# Patient Record
Sex: Female | Born: 1945 | Race: White | Hispanic: No | Marital: Married | Smoking: Never smoker
Health system: Southern US, Community
[De-identification: ages and names within clinical notes are randomized; demographics above are authoritative.]

## PROBLEM LIST (undated history)

## (undated) DIAGNOSIS — G4730 Sleep apnea, unspecified: Secondary | ICD-10-CM

## (undated) DIAGNOSIS — J45909 Unspecified asthma, uncomplicated: Secondary | ICD-10-CM

## (undated) HISTORY — PX: BREAST BIOPSY: SHX20

## (undated) HISTORY — PX: COLONOSCOPY: SHX174

## (undated) HISTORY — PX: UPPER GI ENDOSCOPY: SHX6162

---

## 2022-02-15 ENCOUNTER — Other Ambulatory Visit: Payer: Self-pay | Admitting: Infectious Diseases

## 2022-02-15 DIAGNOSIS — Z1231 Encounter for screening mammogram for malignant neoplasm of breast: Secondary | ICD-10-CM

## 2022-02-16 ENCOUNTER — Ambulatory Visit
Admission: RE | Admit: 2022-02-16 | Discharge: 2022-02-16 | Disposition: A | Discharge status: Home / Self Care | Payer: Medicare PPO | Source: Ambulatory Visit | Attending: Infectious Diseases | Admitting: Infectious Diseases

## 2022-02-16 DIAGNOSIS — Z1231 Encounter for screening mammogram for malignant neoplasm of breast: Secondary | ICD-10-CM | POA: Diagnosis present

## 2022-02-20 ENCOUNTER — Inpatient Hospital Stay
Admission: RE | Admit: 2022-02-20 | Discharge: 2022-02-20 | Disposition: A | Discharge status: Home / Self Care | Payer: Self-pay | Source: Ambulatory Visit | Attending: *Deleted | Admitting: *Deleted

## 2022-02-20 ENCOUNTER — Other Ambulatory Visit: Payer: Self-pay | Admitting: *Deleted

## 2022-02-20 DIAGNOSIS — Z1231 Encounter for screening mammogram for malignant neoplasm of breast: Secondary | ICD-10-CM

## 2022-08-22 ENCOUNTER — Encounter: Payer: Self-pay | Admitting: *Deleted

## 2022-08-22 ENCOUNTER — Ambulatory Visit
Admission: RE | Admit: 2022-08-22 | Discharge: 2022-08-22 | Disposition: A | Discharge status: Home / Self Care | Payer: Medicare PPO | Attending: Gastroenterology | Admitting: Gastroenterology

## 2022-08-22 ENCOUNTER — Other Ambulatory Visit: Payer: Self-pay

## 2022-08-22 ENCOUNTER — Ambulatory Visit: Payer: Medicare PPO | Admitting: Anesthesiology

## 2022-08-22 ENCOUNTER — Encounter
Admission: RE | Disposition: A | Discharge status: Home / Self Care | Payer: Self-pay | Source: Home / Self Care | Attending: Gastroenterology

## 2022-08-22 DIAGNOSIS — Z1211 Encounter for screening for malignant neoplasm of colon: Secondary | ICD-10-CM | POA: Diagnosis present

## 2022-08-22 DIAGNOSIS — J45909 Unspecified asthma, uncomplicated: Secondary | ICD-10-CM | POA: Diagnosis not present

## 2022-08-22 DIAGNOSIS — G4730 Sleep apnea, unspecified: Secondary | ICD-10-CM | POA: Insufficient documentation

## 2022-08-22 DIAGNOSIS — Z86010 Personal history of colonic polyps: Secondary | ICD-10-CM | POA: Diagnosis not present

## 2022-08-22 HISTORY — DX: Unspecified asthma, uncomplicated: J45.909

## 2022-08-22 HISTORY — DX: Sleep apnea, unspecified: G47.30

## 2022-08-22 HISTORY — PX: COLONOSCOPY WITH PROPOFOL: SHX5780

## 2022-08-22 SURGERY — COLONOSCOPY WITH PROPOFOL
Anesthesia: General

## 2022-08-22 MED ORDER — SODIUM CHLORIDE 0.9 % IV SOLN: INTRAVENOUS | Status: DC

## 2022-08-22 MED ORDER — PROPOFOL 10 MG/ML IV BOLUS
INTRAVENOUS | Status: DC | PRN
  Administered 2022-08-22: 70 mg via INTRAVENOUS

## 2022-08-22 MED ORDER — PROPOFOL 500 MG/50ML IV EMUL
INTRAVENOUS | Status: DC | PRN
  Administered 2022-08-22: 150 ug/kg/min via INTRAVENOUS

## 2022-08-22 MED ORDER — LIDOCAINE HCL (CARDIAC) PF 100 MG/5ML IV SOSY
PREFILLED_SYRINGE | INTRAVENOUS | Status: DC | PRN
  Administered 2022-08-22: 50 mg via INTRAVENOUS

## 2022-08-22 NOTE — Anesthesia Procedure Notes (Signed)
Date/Time: 08/22/2022 2:18 PM  Performed by: Ginger Carne, CRNAPre-anesthesia Checklist: Patient identified, Emergency Drugs available, Suction available, Patient being monitored and Timeout performed Patient Re-evaluated:Patient Re-evaluated prior to induction Oxygen Delivery Method: Nasal cannula Preoxygenation: Pre-oxygenation with 100% oxygen Induction Type: IV induction

## 2022-08-22 NOTE — Transfer of Care (Signed)
Immediate Anesthesia Transfer of Care Note  Patient: Jodi Griffin  Procedure(s) Performed: COLONOSCOPY WITH PROPOFOL  Patient Location: PACU  Anesthesia Type:General  Level of Consciousness: awake and drowsy  Airway & Oxygen Therapy: Patient Spontanous Breathing  Post-op Assessment: Report given to RN and Post -op Vital signs reviewed and stable  Post vital signs: Reviewed and stable  Last Vitals:  Vitals Value Taken Time  BP 132/66 08/22/22 1442  Temp    Pulse 71 08/22/22 1442  Resp 16 08/22/22 1442  SpO2 97 % 08/22/22 1442    Last Pain:  Vitals:   08/22/22 1410  TempSrc: Temporal  PainSc: 0-No pain         Complications: No notable events documented.

## 2022-08-22 NOTE — Anesthesia Preprocedure Evaluation (Signed)
Anesthesia Evaluation  Patient identified by MRN, date of birth, ID band Patient awake    Reviewed: Allergy & Precautions, NPO status , Patient's Chart, lab work & pertinent test results  History of Anesthesia Complications Negative for: history of anesthetic complications  Airway Mallampati: III  TM Distance: <3 FB Neck ROM: full    Dental  (+) Chipped   Pulmonary shortness of breath and with exertion, asthma , sleep apnea    Pulmonary exam normal        Cardiovascular (-) angina negative cardio ROS Normal cardiovascular exam     Neuro/Psych negative neurological ROS  negative psych ROS   GI/Hepatic negative GI ROS, Neg liver ROS,neg GERD  ,,  Endo/Other  negative endocrine ROS    Renal/GU negative Renal ROS  negative genitourinary   Musculoskeletal   Abdominal   Peds  Hematology negative hematology ROS (+)   Anesthesia Other Findings Past Medical History: No date: Asthma No date: Sleep apnea  Past Surgical History: No date: BREAST BIOPSY; Left     Comment:  2000 Benign ( stereo for calcs) No date: BREAST BIOPSY; Left     Comment:  2000 benign (stereo for calcs) No date: BREAST BIOPSY; Right     Comment:  2006 benign ( stereo for calcs ) No date: COLONOSCOPY     Comment:  x4 No date: UPPER GI ENDOSCOPY     Reproductive/Obstetrics negative OB ROS                             Anesthesia Physical Anesthesia Plan  ASA: 2  Anesthesia Plan: General   Post-op Pain Management:    Induction: Intravenous  PONV Risk Score and Plan: Propofol infusion and TIVA  Airway Management Planned: Natural Airway and Nasal Cannula  Additional Equipment:   Intra-op Plan:   Post-operative Plan:   Informed Consent: I have reviewed the patients History and Physical, chart, labs and discussed the procedure including the risks, benefits and alternatives for the proposed anesthesia with the  patient or authorized representative who has indicated his/her understanding and acceptance.     Dental Advisory Given  Plan Discussed with: Anesthesiologist, CRNA and Surgeon  Anesthesia Plan Comments: (Patient consented for risks of anesthesia including but not limited to:  - adverse reactions to medications - risk of airway placement if required - damage to eyes, teeth, lips or other oral mucosa - nerve damage due to positioning  - sore throat or hoarseness - Damage to heart, brain, nerves, lungs, other parts of body or loss of life  Patient voiced understanding.)       Anesthesia Quick Evaluation

## 2022-08-22 NOTE — Anesthesia Postprocedure Evaluation (Signed)
Anesthesia Post Note  Patient: Jodi Griffin  Procedure(s) Performed: COLONOSCOPY WITH PROPOFOL  Patient location during evaluation: Endoscopy Anesthesia Type: General Level of consciousness: awake and alert Pain management: pain level controlled Vital Signs Assessment: post-procedure vital signs reviewed and stable Respiratory status: spontaneous breathing, nonlabored ventilation, respiratory function stable and patient connected to nasal cannula oxygen Cardiovascular status: blood pressure returned to baseline and stable Postop Assessment: no apparent nausea or vomiting Anesthetic complications: no   No notable events documented.   Last Vitals:  Vitals:   08/22/22 1452 08/22/22 1502  BP: (!) 144/73 (!) 157/78  Pulse: 74 75  Resp: 20 20  Temp:    SpO2: 100% 100%    Last Pain:  Vitals:   08/22/22 1502  TempSrc:   PainSc: 0-No pain                 Cleda Mccreedy Trevonn Hallum

## 2022-08-22 NOTE — H&P (Signed)
Outpatient short stay form Pre-procedure 08/22/2022  Regis Bill, MD  Primary Physician: Mick Sell, MD  Reason for visit:  Surveillance  History of present illness:    76 y/o lady with history microscopic colitis here for colonoscopy for history of adenomatous polyps on last colonoscopy 5 years ago. No known family history of GI malignancies. No significant abdominal surgeries. No blood thinners.    Current Facility-Administered Medications:    0.9 %  sodium chloride infusion, , Intravenous, Continuous, Rolland Steinert, Rossie Muskrat, MD, Last Rate: 20 mL/hr at 08/22/22 1313, Restarted at 08/22/22 1407  Medications Prior to Admission  Medication Sig Dispense Refill Last Dose   albuterol (VENTOLIN HFA) 108 (90 Base) MCG/ACT inhaler Inhale 2 puffs into the lungs every 6 (six) hours as needed for wheezing or shortness of breath.   08/21/2022   aspirin EC 81 MG tablet Take 81 mg by mouth daily. Swallow whole.   Past Week   celecoxib (CELEBREX) 200 MG capsule Take 200 mg by mouth daily.   Past Week   clobetasol cream (TEMOVATE) 0.05 % Apply 1 Application topically 2 (two) times a week.      conjugated estrogens (PREMARIN) vaginal cream Place 1 applicator vaginally 3 (three) times a week.      cycloSPORINE (RESTASIS) 0.05 % ophthalmic emulsion Place 1 drop into both eyes 2 (two) times daily.      fluticasone (FLONASE) 50 MCG/ACT nasal spray Place 2 sprays into both nostrils daily.      Multiple Vitamin (MULTIVITAMIN) capsule Take 1 capsule by mouth daily.      Omega-3 Fatty Acids (FISH OIL PO) Take 1 capsule by mouth daily.      polyethylene glycol (MIRALAX / GLYCOLAX) 17 g packet Take 17 g by mouth daily.      V-R GLUCOSAMINE PO Take 1 capsule by mouth daily. 1000mg         Allergies  Allergen Reactions   Penicillins Hives   Shellfish Allergy Hives     Past Medical History:  Diagnosis Date   Asthma    Sleep apnea     Review of systems:  Otherwise negative.    Physical  Exam  Gen: Alert, oriented. Appears stated age.  HEENT: PERRLA. Lungs: No respiratory distress CV: RRR Abd: soft, benign, no masses Ext: No edema    Planned procedures: Proceed with colonoscopy. The patient understands the nature of the planned procedure, indications, risks, alternatives and potential complications including but not limited to bleeding, infection, perforation, damage to internal organs and possible oversedation/side effects from anesthesia. The patient agrees and gives consent to proceed.  Please refer to procedure notes for findings, recommendations and patient disposition/instructions.     , MD [facility]

## 2022-08-22 NOTE — Op Note (Signed)
[facility] Gastro[facility] Patient Name: Jodi Griffin Procedure Date: 08/22/2022 1:20 PM MRN: [id number] Account #: [id number] Date of Birth: 1946/05/09 Admit Type: Outpatient Age: 76 Room: [facility] ENDO ROOM 1 Gender: Female Note Status: Finalized Instrument Name: [name] [id number] Procedure:             Colonoscopy Indications:           Surveillance: Personal history of adenomatous polyps                         on last colonoscopy 5 years ago Providers:             Andrey Farmer MD, MD Referring MD:          Adrian Prows (Referring MD) Medicines:             Monitored Anesthesia Care Complications:         No immediate complications. Procedure:             Pre-Anesthesia Assessment:                        - Prior to the procedure, a History and Physical was                         performed, and patient medications and allergies were                         reviewed. The patient is competent. The risks and                         benefits of the procedure and the sedation options and                         risks were discussed with the patient. All questions                         were answered and informed consent was obtained.                         Patient identification and proposed procedure were                         verified by the physician, the nurse, the                         anesthesiologist, the anesthetist and the technician                         in the endoscopy suite. Mental Status Examination:                         alert and oriented. Airway Examination: normal                         oropharyngeal airway and neck mobility. Respiratory                         Examination: clear to auscultation. CV Examination:  normal. Prophylactic Antibiotics: The patient does not                         require prophylactic antibiotics. Prior                         Anticoagulants: The patient has taken no  anticoagulant                         or antiplatelet agents. ASA Grade Assessment: II - A                         patient with mild systemic disease. After reviewing                         the risks and benefits, the patient was deemed in                         satisfactory condition to undergo the procedure. The                         anesthesia plan was to use monitored anesthesia care                         (MAC). Immediately prior to administration of                         medications, the patient was re-assessed for adequacy                         to receive sedatives. The heart rate, respiratory                         rate, oxygen saturations, blood pressure, adequacy of                         pulmonary ventilation, and response to care were                         monitored throughout the procedure. The physical                         status of the patient was re-assessed after the                         procedure.                        After obtaining informed consent, the colonoscope was                         passed under direct vision. Throughout the procedure,                         the patient's blood pressure, pulse, and oxygen                         saturations were monitored continuously. The  Colonoscope was introduced through the anus and                         advanced to the the cecum, identified by appendiceal                         orifice and ileocecal valve. The colonoscopy was                         somewhat difficult due to a tortuous colon. Successful                         completion of the procedure was aided by applying                         abdominal pressure. The patient tolerated the                         procedure well. The quality of the bowel preparation                         was good. The ileocecal valve, appendiceal orifice,                         and rectum were photographed. Findings:      The  perianal and digital rectal examinations were normal.      The entire examined colon appeared normal on direct and retroflexion       views. Impression:            - The entire examined colon is normal on direct and                         retroflexion views.                        - No specimens collected. Recommendation:        - Discharge patient to home.                        - Resume previous diet.                        - Continue present medications.                        - Repeat colonoscopy is not recommended due to current                         age (32 years or older) for surveillance.                        - Return to referring physician as previously                         scheduled. Procedure Code(s):     --- Professional ---                        T5974, Colorectal cancer screening; colonoscopy on  individual at high risk Diagnosis Code(s):     --- Professional ---                        Z86.010, Personal history of colonic polyps CPT copyright 2022 [employer]. All rights reserved. The codes documented in this report are preliminary and upon coder review may  be revised to meet current compliance requirements. Andrey Farmer MD, MD 08/22/2022 2:41:16 PM Number of Addenda: 0 Note Initiated On: 08/22/2022 1:20 PM Scope Withdrawal Time: 0 hours 7 minutes 2 seconds  Total Procedure Duration: 0 hours 14 minutes 35 seconds  Estimated Blood Loss:  Estimated blood loss: none.      [facility]

## 2022-08-22 NOTE — Interval H&P Note (Signed)
History and Physical Interval Note:  08/22/2022 2:12 PM  Jodi Griffin  has presented today for surgery, with the diagnosis of Hx TA Polyps.  The various methods of treatment have been discussed with the patient and family. After consideration of risks, benefits and other options for treatment, the patient has consented to  Procedure(s): COLONOSCOPY WITH PROPOFOL (N/A) as a surgical intervention.  The patient's history has been reviewed, patient examined, no change in status, stable for surgery.  I have reviewed the patient's chart and labs.  Questions were answered to the patient's satisfaction.     Regis Bill  Ok to proceed with colonoscopy

## 2022-08-23 ENCOUNTER — Encounter: Payer: Self-pay | Admitting: Gastroenterology

## 2022-08-25 IMAGING — MG MM DIGITAL SCREENING BILAT W/ TOMO AND CAD
8 series · 8 of 24 positions shown · non-contrast
Comparison: Previous exam(s).

CLINICAL DATA: Screening.

EXAM:
DIGITAL SCREENING BILATERAL MAMMOGRAM WITH TOMOSYNTHESIS AND CAD
TECHNIQUE: Bilateral screening digital craniocaudal and mediolateral oblique
mammograms were obtained. Bilateral screening digital breast
tomosynthesis was performed. The images were evaluated with
computer-aided detection.

[L MLO synth-2D]
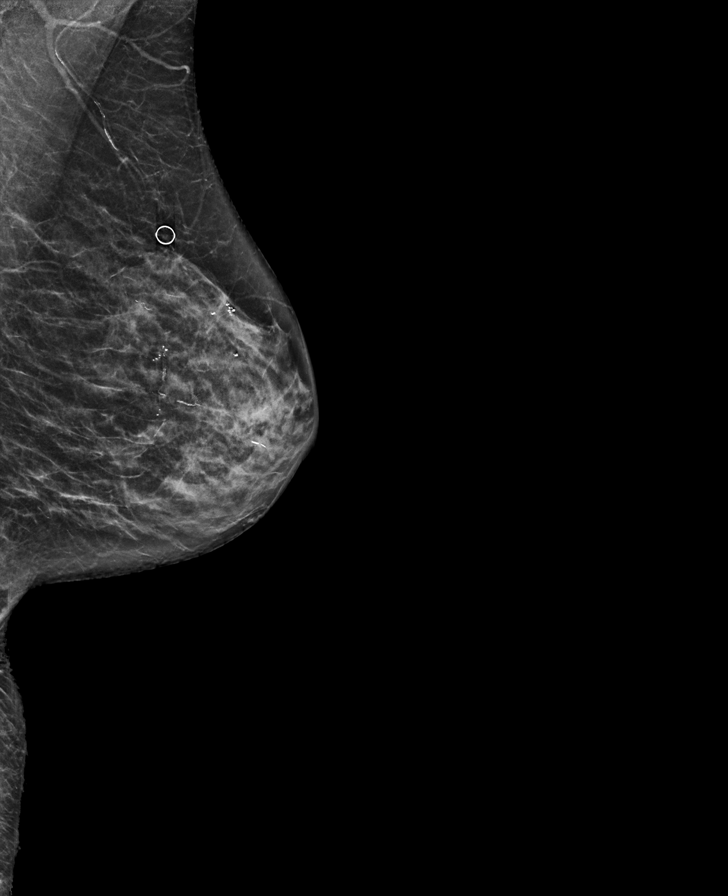

[L CC synth-2D]
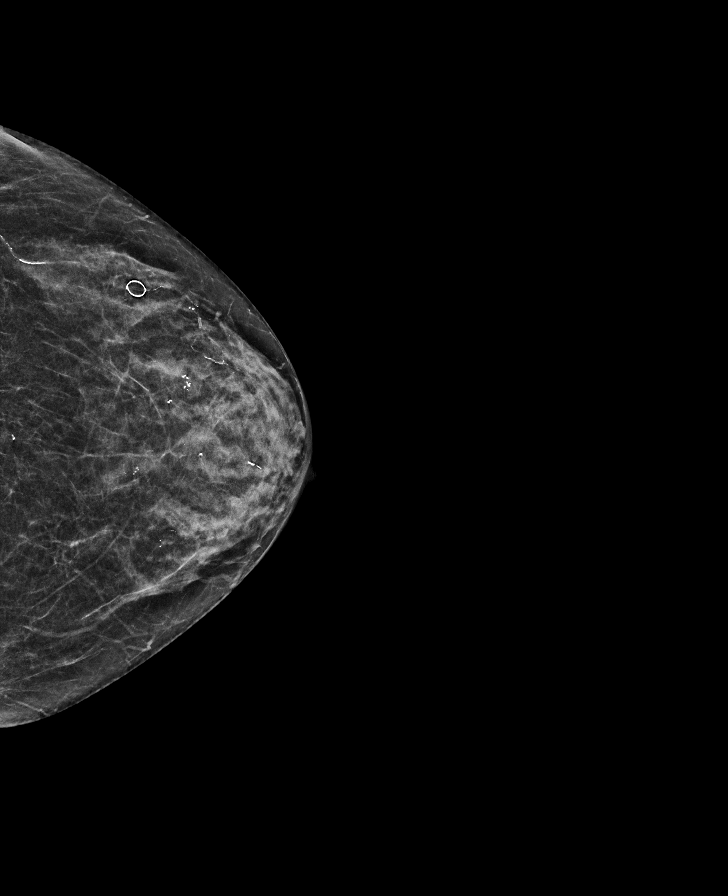

[R MLO synth-2D]
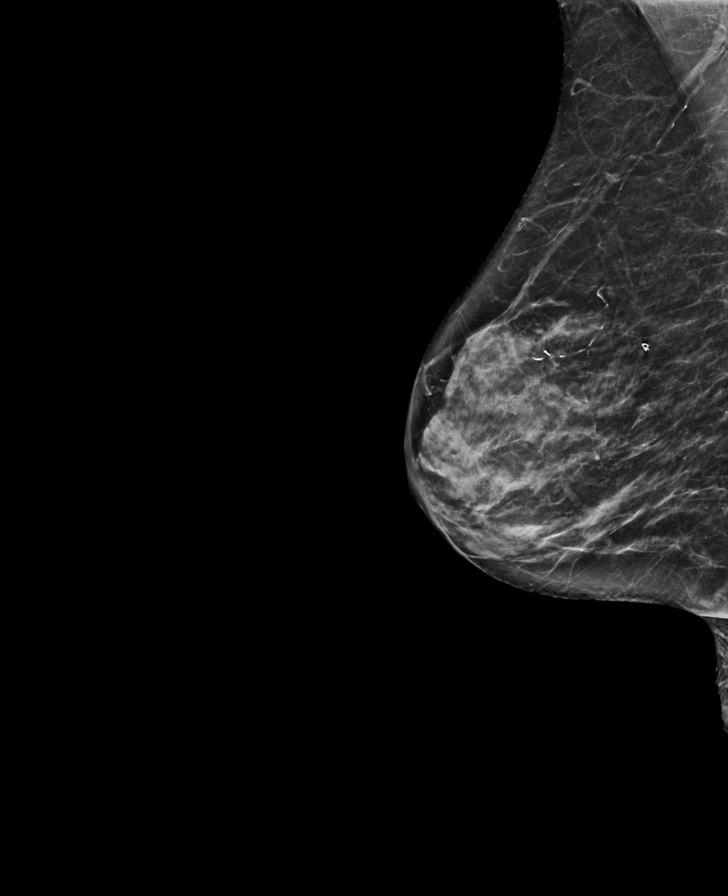

[R CC synth-2D]
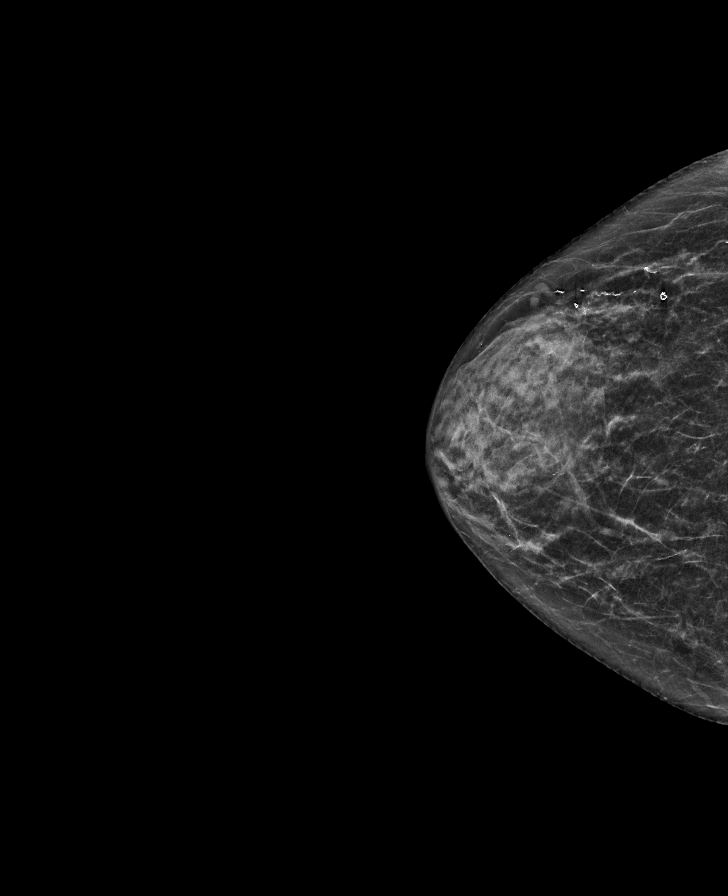

[R MLO tomo · tomo slice 31/60.0]
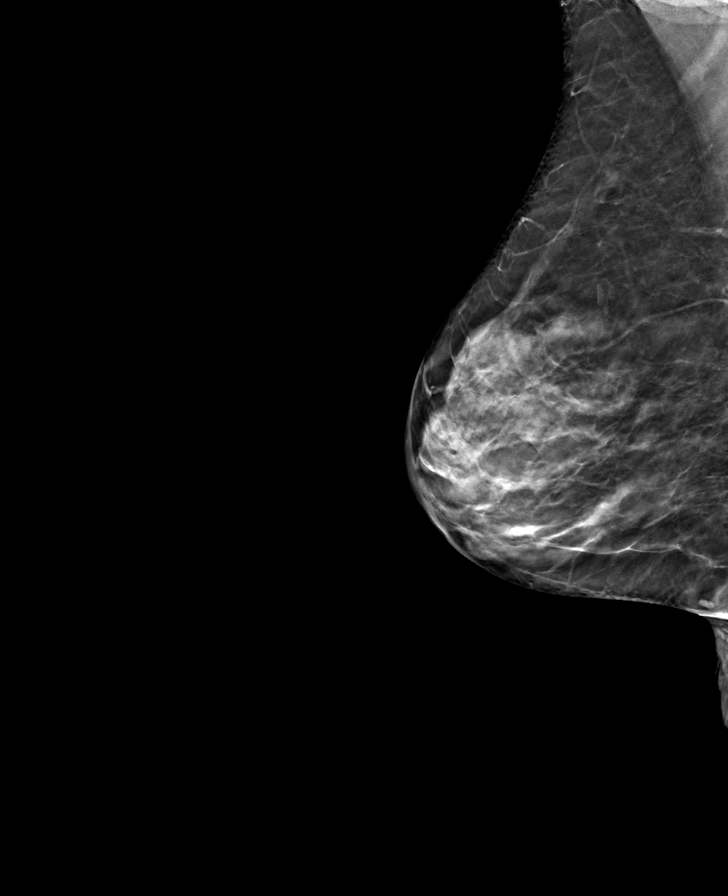

[L MLO tomo · tomo slice 31/61.0]
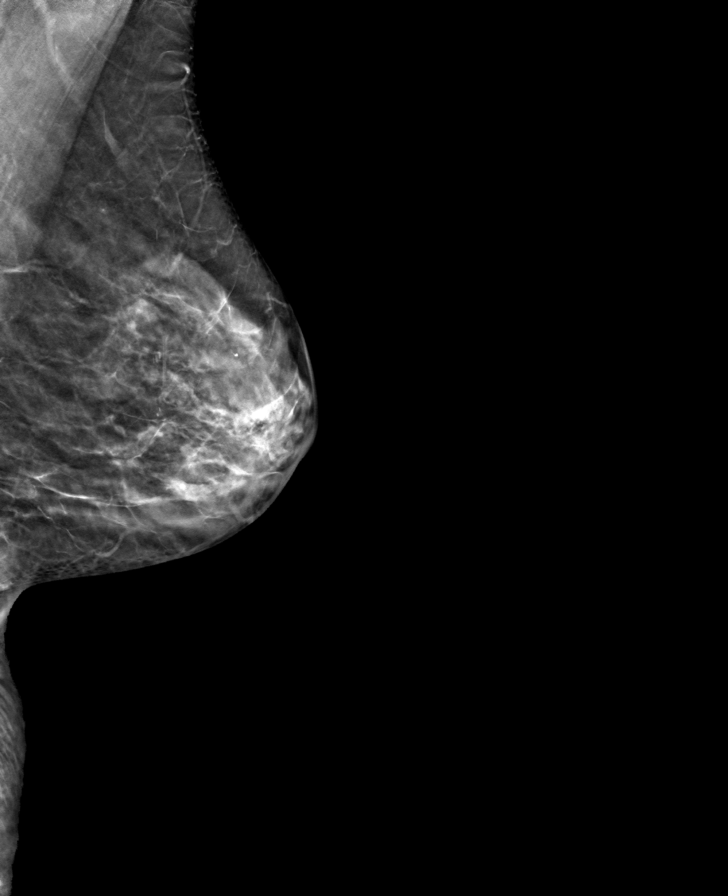

[L CC tomo · tomo slice 23/45.0]
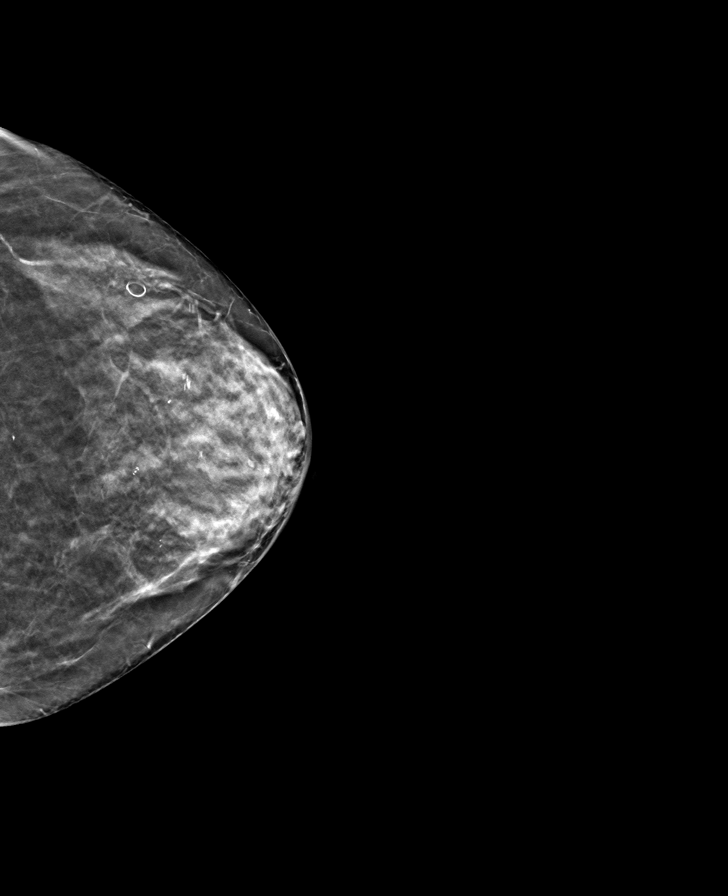

[R CC tomo · tomo slice 27/53.0]
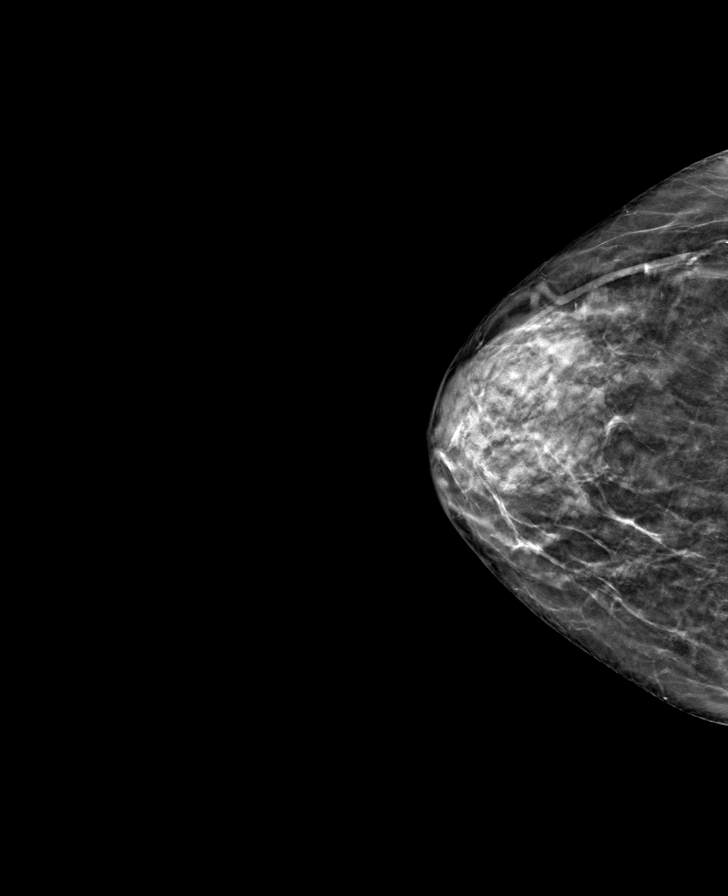

[8 of 24 positions shown; findings below may reference images not displayed]

ACR Breast Density Category c: The breast tissue is heterogeneously
dense, which may obscure small masses.
FINDINGS: There are no findings suspicious for malignancy.
IMPRESSION: No mammographic evidence of malignancy. A result letter of this
screening mammogram will be mailed directly to the patient.

RECOMMENDATION:
Screening mammogram in one year. (Code:Q3-W-BC3)

BI-RADS CATEGORY  1: Negative.

## 2023-01-03 ENCOUNTER — Other Ambulatory Visit: Payer: Self-pay | Admitting: Infectious Diseases

## 2023-01-03 DIAGNOSIS — Z1231 Encounter for screening mammogram for malignant neoplasm of breast: Secondary | ICD-10-CM

## 2023-02-20 ENCOUNTER — Ambulatory Visit
Admission: RE | Admit: 2023-02-20 | Discharge: 2023-02-20 | Disposition: A | Discharge status: Home / Self Care | Payer: Medicare PPO | Source: Ambulatory Visit | Attending: Infectious Diseases | Admitting: Infectious Diseases

## 2023-02-20 DIAGNOSIS — Z1231 Encounter for screening mammogram for malignant neoplasm of breast: Secondary | ICD-10-CM | POA: Diagnosis present

## 2023-02-23 ENCOUNTER — Other Ambulatory Visit: Payer: Self-pay | Admitting: Infectious Diseases

## 2023-02-23 DIAGNOSIS — R928 Other abnormal and inconclusive findings on diagnostic imaging of breast: Secondary | ICD-10-CM

## 2023-02-23 DIAGNOSIS — N6489 Other specified disorders of breast: Secondary | ICD-10-CM

## 2023-02-27 ENCOUNTER — Other Ambulatory Visit: Payer: Self-pay | Admitting: Infectious Diseases

## 2023-02-27 DIAGNOSIS — E7800 Pure hypercholesterolemia, unspecified: Secondary | ICD-10-CM

## 2023-02-27 DIAGNOSIS — Z23 Encounter for immunization: Secondary | ICD-10-CM

## 2023-03-01 ENCOUNTER — Ambulatory Visit
Admission: RE | Admit: 2023-03-01 | Discharge: 2023-03-01 | Disposition: A | Discharge status: Home / Self Care | Payer: Medicare PPO | Source: Ambulatory Visit | Attending: Infectious Diseases | Admitting: Infectious Diseases

## 2023-03-01 DIAGNOSIS — N6489 Other specified disorders of breast: Secondary | ICD-10-CM

## 2023-03-01 DIAGNOSIS — R928 Other abnormal and inconclusive findings on diagnostic imaging of breast: Secondary | ICD-10-CM | POA: Insufficient documentation

## 2023-03-06 ENCOUNTER — Ambulatory Visit
Admission: RE | Admit: 2023-03-06 | Discharge: 2023-03-06 | Disposition: A | Discharge status: Home / Self Care | Payer: Medicare PPO | Source: Ambulatory Visit | Attending: Infectious Diseases | Admitting: Infectious Diseases

## 2023-03-06 DIAGNOSIS — Z23 Encounter for immunization: Secondary | ICD-10-CM | POA: Insufficient documentation

## 2023-03-06 DIAGNOSIS — E7800 Pure hypercholesterolemia, unspecified: Secondary | ICD-10-CM | POA: Insufficient documentation

## 2024-01-23 ENCOUNTER — Other Ambulatory Visit: Payer: Self-pay | Admitting: Infectious Diseases

## 2024-01-23 DIAGNOSIS — Z1231 Encounter for screening mammogram for malignant neoplasm of breast: Secondary | ICD-10-CM

## 2024-03-07 ENCOUNTER — Ambulatory Visit
Admission: RE | Admit: 2024-03-07 | Discharge: 2024-03-07 | Disposition: A | Discharge status: Home / Self Care | Source: Ambulatory Visit | Attending: Infectious Diseases | Admitting: Infectious Diseases

## 2024-03-07 DIAGNOSIS — Z1231 Encounter for screening mammogram for malignant neoplasm of breast: Secondary | ICD-10-CM | POA: Insufficient documentation
